# Patient Record
Sex: Male | Born: 1990 | Race: White | Hispanic: No | Marital: Single | Smoking: Current some day smoker
Health system: Southern US, Community
[De-identification: ages and names within clinical notes are randomized; demographics above are authoritative.]

---
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
## 2011-01-15 ENCOUNTER — Ambulatory Visit: Payer: Self-pay | Admitting: Internal Medicine

## 2012-01-21 ENCOUNTER — Ambulatory Visit: Payer: Self-pay | Admitting: Internal Medicine

## 2012-02-13 ENCOUNTER — Ambulatory Visit: Payer: Self-pay | Admitting: Family Medicine

## 2012-04-04 ENCOUNTER — Emergency Department: Payer: Self-pay | Admitting: *Deleted

## 2012-04-04 LAB — COMPREHENSIVE METABOLIC PANEL
Albumin: 4.4 g/dL (ref 3.4–5.0)
Alkaline Phosphatase: 104 U/L (ref 50–136)
Anion Gap: 7 (ref 7–16)
BUN: 14 mg/dL (ref 7–18)
Bilirubin,Total: 0.6 mg/dL (ref 0.2–1.0)
Calcium, Total: 8.8 mg/dL (ref 8.5–10.1)
Chloride: 104 mmol/L (ref 98–107)
Co2: 26 mmol/L (ref 21–32)
Creatinine: 1.1 mg/dL (ref 0.60–1.30)
EGFR (African American): >60
EGFR (Non-African Amer.): >60
Glucose: 140 mg/dL — ABNORMAL HIGH (ref 65–99)
Osmolality: 277 (ref 275–301)
Potassium: 4.3 mmol/L (ref 3.5–5.1)
SGOT(AST): 52 U/L — ABNORMAL HIGH (ref 15–37)
SGPT (ALT): 41 U/L
Sodium: 137 mmol/L (ref 136–145)
Total Protein: 8 g/dL (ref 6.4–8.2)

## 2012-04-04 LAB — CBC WITH DIFFERENTIAL/PLATELET
Basophil #: 0 10*3/uL (ref 0.0–0.1)
Basophil %: 0.1 %
Eosinophil %: 0 %
HCT: 43.8 % (ref 40.0–52.0)
HGB: 15.1 g/dL (ref 13.0–18.0)
Lymphocyte #: 0.8 10*3/uL — ABNORMAL LOW (ref 1.0–3.6)
Lymphocyte %: 6.4 %
MCH: 31.4 pg (ref 26.0–34.0)
MCHC: 34.6 g/dL (ref 32.0–36.0)
MCV: 91 fL (ref 80–100)
Monocyte #: 1.1 x10 3/mm — ABNORMAL HIGH (ref 0.2–1.0)
Monocyte %: 8 %
Neutrophil #: 11.2 10*3/uL — ABNORMAL HIGH (ref 1.4–6.5)
Platelet: 155 10*3/uL (ref 150–440)
RDW: 13.3 % (ref 11.5–14.5)
WBC: 13.1 10*3/uL — ABNORMAL HIGH (ref 3.8–10.6)

## 2012-04-04 LAB — LIPASE, BLOOD: Lipase: 78 U/L (ref 73–393)

## 2012-04-04 LAB — ETHANOL
Ethanol %: 0.039 % (ref 0.000–0.080)
Ethanol: 39 mg/dL

## 2012-04-09 ENCOUNTER — Encounter: Payer: Self-pay | Admitting: Nurse Practitioner

## 2012-04-09 ENCOUNTER — Encounter: Payer: Self-pay | Admitting: Cardiothoracic Surgery

## 2012-04-23 LAB — WOUND CULTURE

## 2012-04-30 ENCOUNTER — Encounter: Payer: Self-pay | Admitting: Nurse Practitioner

## 2012-04-30 ENCOUNTER — Encounter: Payer: Self-pay | Admitting: Cardiothoracic Surgery

## 2012-05-31 ENCOUNTER — Encounter: Payer: Self-pay | Admitting: Cardiothoracic Surgery

## 2012-05-31 ENCOUNTER — Encounter: Payer: Self-pay | Admitting: Nurse Practitioner

## 2013-02-18 ENCOUNTER — Encounter: Payer: Self-pay | Admitting: Nurse Practitioner

## 2013-02-18 ENCOUNTER — Encounter: Payer: Self-pay | Admitting: Cardiothoracic Surgery

## 2013-02-28 ENCOUNTER — Encounter: Payer: Self-pay | Admitting: Cardiothoracic Surgery

## 2013-02-28 ENCOUNTER — Encounter: Payer: Self-pay | Admitting: Nurse Practitioner

## 2015-03-31 ENCOUNTER — Emergency Department
Admission: EM | Admit: 2015-03-31 | Discharge: 2015-03-31 | Disposition: A | Discharge status: Home / Self Care | Payer: BLUE CROSS/BLUE SHIELD | Attending: Emergency Medicine | Admitting: Emergency Medicine

## 2015-03-31 ENCOUNTER — Emergency Department: Payer: BLUE CROSS/BLUE SHIELD

## 2015-03-31 ENCOUNTER — Encounter: Payer: Self-pay | Admitting: Emergency Medicine

## 2015-03-31 DIAGNOSIS — Z720 Tobacco use: Secondary | ICD-10-CM | POA: Diagnosis not present

## 2015-03-31 DIAGNOSIS — M546 Pain in thoracic spine: Secondary | ICD-10-CM

## 2015-03-31 DIAGNOSIS — Z880 Allergy status to penicillin: Secondary | ICD-10-CM | POA: Insufficient documentation

## 2015-03-31 DIAGNOSIS — M545 Low back pain, unspecified: Secondary | ICD-10-CM

## 2015-03-31 MED ORDER — PREDNISONE 20 MG PO TABS
60.0000 mg | ORAL_TABLET | Freq: Once | ORAL | Status: AC
Start: 2015-03-31 — End: 2015-03-31
  Administered 2015-03-31: 60 mg via ORAL

## 2015-03-31 MED ORDER — OXYCODONE-ACETAMINOPHEN 5-325 MG PO TABS: 2.0000 | ORAL_TABLET | ORAL | Status: AC | PRN

## 2015-03-31 MED ORDER — PREDNISONE 20 MG PO TABS: 60.0000 mg | ORAL_TABLET | Freq: Every day | ORAL | Status: AC

## 2015-03-31 MED ORDER — OXYCODONE-ACETAMINOPHEN 5-325 MG PO TABS
2.0000 | ORAL_TABLET | Freq: Once | ORAL | Status: AC
  Administered 2015-03-31: 2 via ORAL

## 2015-03-31 MED ORDER — KETOROLAC TROMETHAMINE 60 MG/2ML IM SOLN
INTRAMUSCULAR | Status: AC
  Administered 2015-03-31: 60 mg via INTRAMUSCULAR
  Filled 2015-03-31: qty 2

## 2015-03-31 MED ORDER — KETOROLAC TROMETHAMINE 60 MG/2ML IM SOLN
60.0000 mg | Freq: Once | INTRAMUSCULAR | Status: AC
  Administered 2015-03-31: 60 mg via INTRAMUSCULAR

## 2015-03-31 MED ORDER — PREDNISONE 20 MG PO TABS
ORAL_TABLET | ORAL | Status: AC
  Administered 2015-03-31: 60 mg via ORAL
  Filled 2015-03-31: qty 3

## 2015-03-31 MED ORDER — OXYCODONE-ACETAMINOPHEN 5-325 MG PO TABS
ORAL_TABLET | ORAL | Status: AC
  Administered 2015-03-31: 2 via ORAL
  Filled 2015-03-31: qty 2

## 2015-03-31 NOTE — ED Notes (Signed)
Patient with no complaints at this time. Respirations even and unlabored. Skin warm/dry. Discharge instructions reviewed with patient at this time. Patient given opportunity to voice concerns/ask questions. Patient discharged at this time and left Emergency Department with steady gait.   

## 2015-03-31 NOTE — ED Notes (Signed)
Pt in with mid back pain since Tuesday, was seen at walk in clinic and given muscle relaxants and states not better.  Hx of back pain but states its worse.

## 2015-03-31 NOTE — ED Provider Notes (Signed)
[facility] Emergency Department Provider Note  ____________________________________________  Time seen: 5:15 AM  I have reviewed the triage vital signs and the nursing notes.   HISTORY  Chief Complaint Back Pain      HPI Dustin Wilson is a 24 y.o. male presents with mid back pain is currently 9 out of 10 described as sharp worse with movement and alleviated by being still.     Past medical history Lumbar spine compression fracture There are no active problems to display for this patient.    No current outpatient prescriptions on file.  Allergies Penicillins  History reviewed. No pertinent family history.  Social History History  Substance Use Topics  . Smoking status: Current Some Day Smoker  . Smokeless tobacco: Not on file  . Alcohol Use: Yes    Review of Systems  Constitutional: Negative for fever. Eyes: Negative for visual changes. ENT: Negative for sore throat. Cardiovascular: Negative for chest pain. Respiratory: Negative for shortness of breath. Gastrointestinal: Negative for abdominal pain, vomiting and diarrhea. Genitourinary: Negative for dysuria. Musculoskeletal: Negative for back pain. Skin: Negative for rash. Neurological: Negative for headaches, focal weakness or numbness.   10-point ROS otherwise negative.  ____________________________________________   PHYSICAL EXAM:  VITAL SIGNS: ED Triage Vitals  Enc Vitals Group     BP 03/31/15 0158 157/83 mmHg     Pulse Rate 03/31/15 0158 79     Resp 03/31/15 0158 18     Temp 03/31/15 0158 97.8 F (36.6 C)     Temp Source 03/31/15 0158 Oral     SpO2 03/31/15 0158 99 %     Weight 03/31/15 0158 215 lb (97.523 kg)     Height 03/31/15 0158 6' (1.829 m)     Head Cir --      Peak Flow --      Pain Score 03/31/15 0159 10     Pain Loc --      Pain Edu? --      Excl. in GC? --      Constitutional: Alert and oriented. Well appearing and in no distress. Eyes:  Conjunctivae are normal. PERRL. Normal extraocular movements. ENT   Head: Normocephalic and atraumatic.   Nose: No congestion/rhinnorhea.   Mouth/Throat: Mucous membranes are moist.   Neck: No stridor. Hematological/Lymphatic/Immunilogical: No cervical lymphadenopathy. Cardiovascular: Normal rate, regular rhythm. Normal and symmetric distal pulses are present in all extremities. No murmurs, rubs, or gallops. Respiratory: Normal respiratory effort without tachypnea nor retractions. Breath sounds are clear and equal bilaterally. No wheezes/rales/rhonchi. Gastrointestinal: Soft and nontender. No distention. There is no CVA tenderness. Genitourinary: deferred Musculoskeletal: Pain with palpation of the paraspinal muscles in the thoracic and lumbar region. Neurologic:  Normal speech and language. No gross focal neurologic deficits are appreciated. Speech is normal.  Skin:  Skin is warm, dry and intact. No rash noted. Psychiatric: Mood and affect are normal. Speech and behavior are normal. Patient exhibits appropriate insight and judgment.   RADIOLOGY  T and L spine revealed:  IMPRESSION: Negative for acute fracture. Mild chronic anterior wedging of T8.   IMPRESSION: Mild chronic anterior compression of L4  INITIAL IMPRESSION / ASSESSMENT AND PLAN / ED COURSE  Pertinent labs & imaging results that were available during my care of the patient were reviewed by me and considered in my medical decision making (see chart for details).    ____________________________________________   FINAL CLINICAL IMPRESSION(S) / ED DIAGNOSES  Final diagnoses:  Bilateral thoracic back pain  Darci Current, MD 04/05/15 [phone]

## 2015-03-31 NOTE — Discharge Instructions (Signed)
Back Pain, Adult Low back pain is very common. About 1 in 5 people have back pain.The cause of low back pain is rarely dangerous. The pain often gets better over time.About half of people with a sudden onset of back pain feel better in just 2 weeks. About 8 in 10 people feel better by 6 weeks.  CAUSES Some common causes of back pain include:  Strain of the muscles or ligaments supporting the spine.  Wear and tear (degeneration) of the spinal discs.  Arthritis.  Direct injury to the back. DIAGNOSIS Most of the time, the direct cause of low back pain is not known.However, back pain can be treated effectively even when the exact cause of the pain is unknown.Answering your caregiver's questions about your overall health and symptoms is one of the most accurate ways to make sure the cause of your pain is not dangerous. If your caregiver needs more information, he or she may order lab work or imaging tests (X-rays or MRIs).However, even if imaging tests show changes in your back, this usually does not require surgery. HOME CARE INSTRUCTIONS For many people, back pain returns.Since low back pain is rarely dangerous, it is often a condition that people can learn to manageon their own.   Remain active. It is stressful on the back to sit or stand in one place. Do not sit, drive, or stand in one place for more than 30 minutes at a time. Take short walks on level surfaces as soon as pain allows.Try to increase the length of time you walk each day.  Do not stay in bed.Resting more than 1 or 2 days can delay your recovery.  Do not avoid exercise or work.Your body is made to move.It is not dangerous to be active, even though your back may hurt.Your back will likely heal faster if you return to being active before your pain is gone.  Pay attention to your body when you bend and lift. Many people have less discomfortwhen lifting if they bend their knees, keep the load close to their bodies,and  avoid twisting. Often, the most comfortable positions are those that put less stress on your recovering back.  Find a comfortable position to sleep. Use a firm mattress and lie on your side with your knees slightly bent. If you lie on your back, put a pillow under your knees.  Only take over-the-counter or prescription medicines as directed by your caregiver. Over-the-counter medicines to reduce pain and inflammation are often the most helpful.Your caregiver may prescribe muscle relaxant drugs.These medicines help dull your pain so you can more quickly return to your normal activities and healthy exercise.  Put ice on the injured area.  Put ice in a plastic bag.  Place a towel between your skin and the bag.  Leave the ice on for 15-20 minutes, 03-04 times a day for the first 2 to 3 days. After that, ice and heat may be alternated to reduce pain and spasms.  Ask your caregiver about trying back exercises and gentle massage. This may be of some benefit.  Avoid feeling anxious or stressed.Stress increases muscle tension and can worsen back pain.It is important to recognize when you are anxious or stressed and learn ways to manage it.Exercise is a great option. SEEK MEDICAL CARE IF:  You have pain that is not relieved with rest or medicine.  You have pain that does not improve in 1 week.  You have new symptoms.  You are generally not feeling well. SEEK   IMMEDIATE MEDICAL CARE IF:   You have pain that radiates from your back into your legs.  You develop new bowel or bladder control problems.  You have unusual weakness or numbness in your arms or legs.  You develop nausea or vomiting.  You develop abdominal pain.  You feel faint. Document Released: 09/16/2005 Document Revised: 03/17/2012 Document Reviewed: 01/18/2014 [employer] Patient Information 2015 [employer]. This information is not intended to replace advice given to you by your health care provider. Make sure you  discuss any questions you have with your health care provider.  

## 2015-09-16 IMAGING — CR DG THORACIC SPINE 2V
1 series · 3 of 3 positions shown · non-contrast
Comparison: None.

ADDENDUM:
Three views of the thoracic spine were obtained.
CLINICAL DATA: Mid back pain since [REDACTED], not improving on muscle
relaxants.

EXAM:
THORACIC SPINE - 2-3 VIEWS

[Series 1: t thoracic spine ap · 0.14mm/px · 3 of 3 slices shown]
[im 1/3]
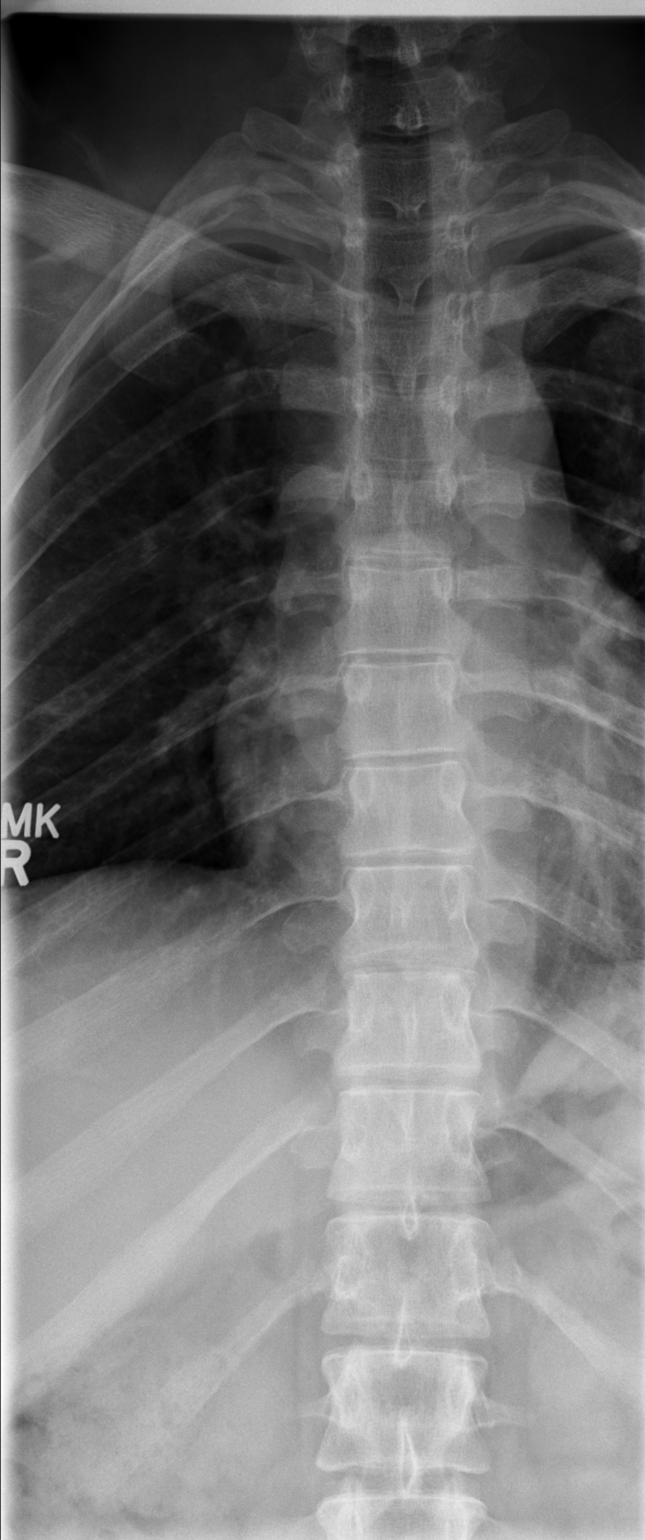
[im 2/3]
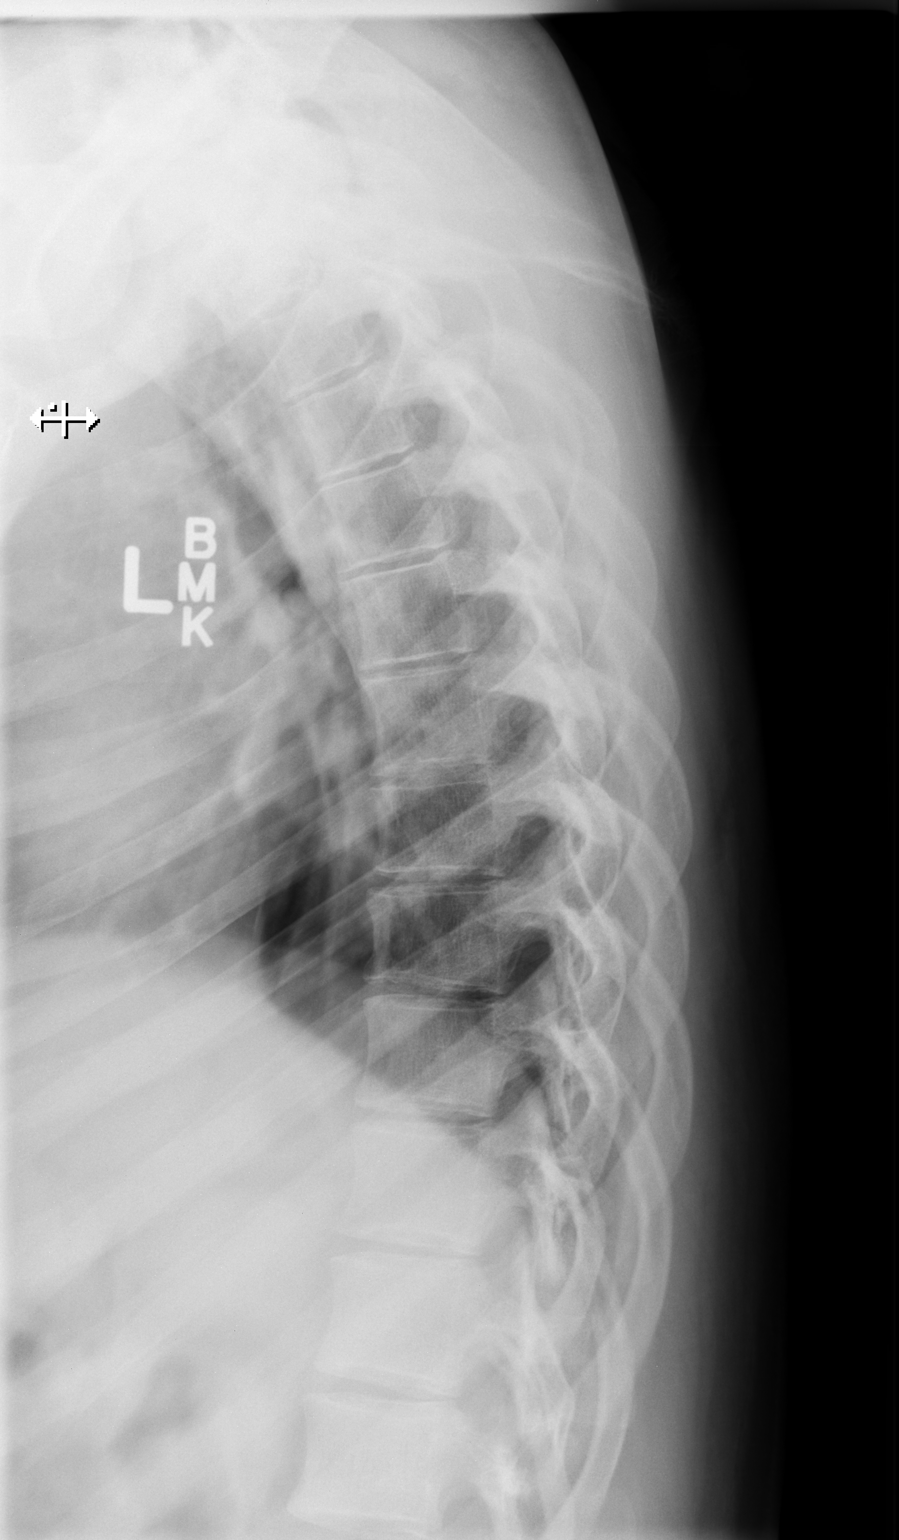
[im 3/3]
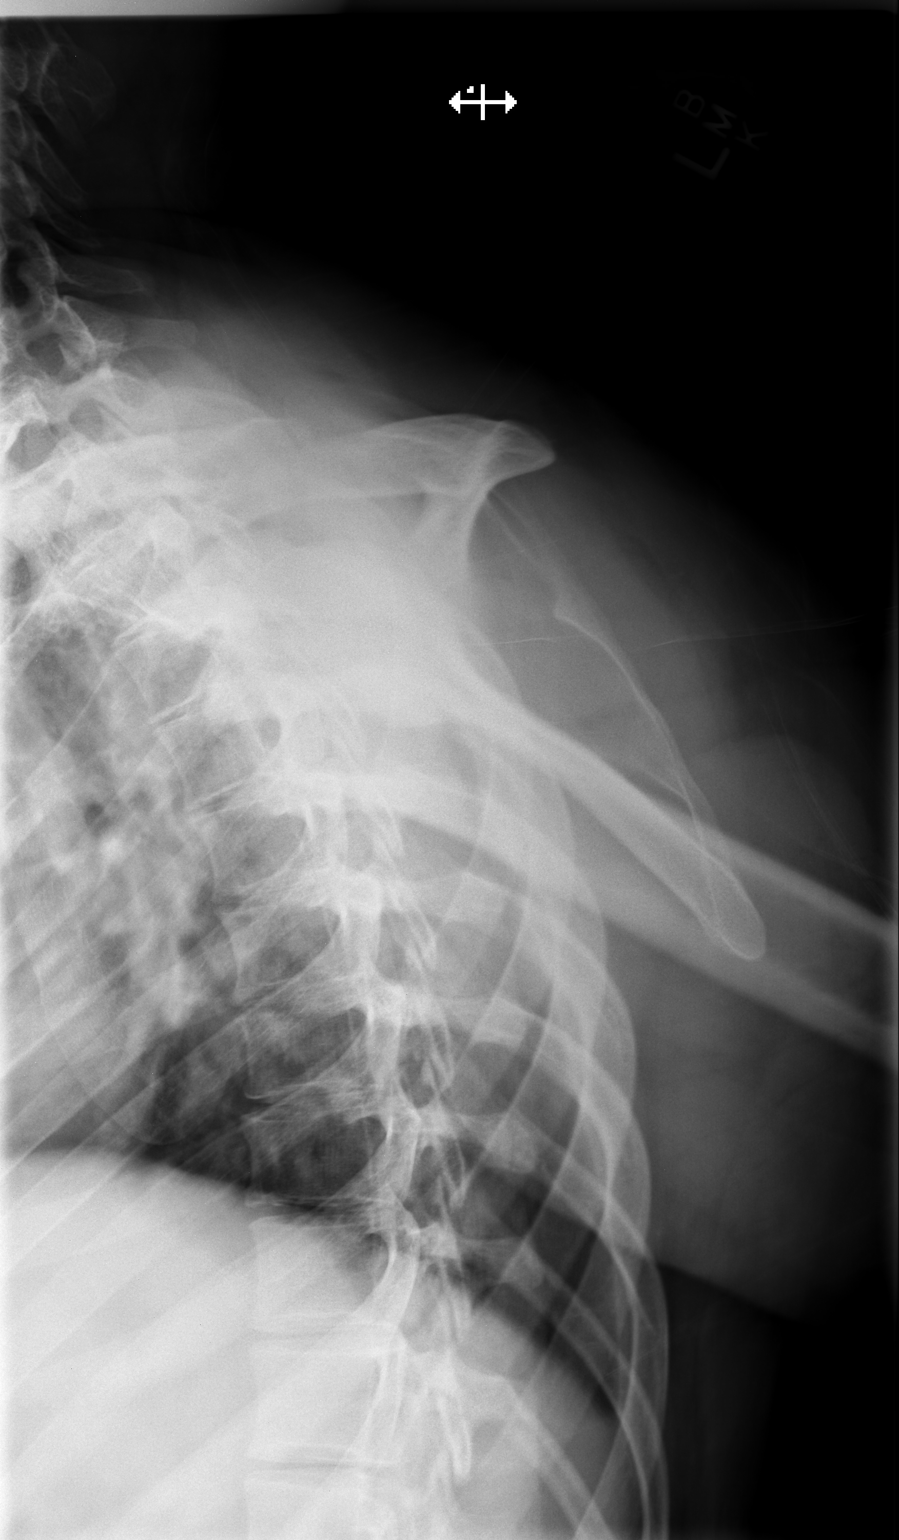

[3 of 3 positions shown; findings below may reference images not displayed]

FINDINGS: There is minimal anterior wedging of T8, chronic. No acute
compressions are evident. No significant arthritic changes are
evident. No bone lesions or bony destruction are evident.
IMPRESSION: Negative for acute fracture.  Mild chronic anterior wedging of T8.
